# Patient Record
Sex: Male | Born: 1980 | Race: Black or African American | Hispanic: No | Marital: Single | Smoking: Current every day smoker
Health system: Southern US, Community
[De-identification: ages and names within clinical notes are randomized; demographics above are authoritative.]

## PROBLEM LIST (undated history)

## (undated) HISTORY — PX: KNEE SURGERY: SHX244

---

## 2005-11-23 ENCOUNTER — Emergency Department: Payer: Self-pay | Admitting: Unknown Physician Specialty

## 2006-06-12 ENCOUNTER — Emergency Department: Payer: Self-pay | Admitting: Emergency Medicine

## 2008-05-07 ENCOUNTER — Emergency Department: Payer: Self-pay | Admitting: Emergency Medicine

## 2014-09-19 ENCOUNTER — Emergency Department: Payer: No Typology Code available for payment source

## 2014-09-19 ENCOUNTER — Encounter: Payer: Self-pay | Admitting: Emergency Medicine

## 2014-09-19 ENCOUNTER — Emergency Department
Admission: EM | Admit: 2014-09-19 | Discharge: 2014-09-19 | Disposition: A | Discharge status: Home / Self Care | Payer: No Typology Code available for payment source | Attending: Internal Medicine | Admitting: Internal Medicine

## 2014-09-19 DIAGNOSIS — Y92410 Unspecified street and highway as the place of occurrence of the external cause: Secondary | ICD-10-CM | POA: Diagnosis not present

## 2014-09-19 DIAGNOSIS — S060X9A Concussion with loss of consciousness of unspecified duration, initial encounter: Secondary | ICD-10-CM

## 2014-09-19 DIAGNOSIS — S39012A Strain of muscle, fascia and tendon of lower back, initial encounter: Secondary | ICD-10-CM | POA: Diagnosis not present

## 2014-09-19 DIAGNOSIS — S0081XA Abrasion of other part of head, initial encounter: Secondary | ICD-10-CM | POA: Diagnosis not present

## 2014-09-19 DIAGNOSIS — S40211A Abrasion of right shoulder, initial encounter: Secondary | ICD-10-CM | POA: Diagnosis not present

## 2014-09-19 DIAGNOSIS — Y9389 Activity, other specified: Secondary | ICD-10-CM | POA: Diagnosis not present

## 2014-09-19 DIAGNOSIS — S40812A Abrasion of left upper arm, initial encounter: Secondary | ICD-10-CM | POA: Diagnosis not present

## 2014-09-19 DIAGNOSIS — S161XXA Strain of muscle, fascia and tendon at neck level, initial encounter: Secondary | ICD-10-CM | POA: Diagnosis not present

## 2014-09-19 DIAGNOSIS — Y998 Other external cause status: Secondary | ICD-10-CM | POA: Diagnosis not present

## 2014-09-19 DIAGNOSIS — S0990XA Unspecified injury of head, initial encounter: Secondary | ICD-10-CM | POA: Diagnosis present

## 2014-09-19 DIAGNOSIS — Z720 Tobacco use: Secondary | ICD-10-CM | POA: Diagnosis not present

## 2014-09-19 DIAGNOSIS — R52 Pain, unspecified: Secondary | ICD-10-CM

## 2014-09-19 DIAGNOSIS — S060X1A Concussion with loss of consciousness of 30 minutes or less, initial encounter: Secondary | ICD-10-CM

## 2014-09-19 MED ORDER — HYDROCODONE-ACETAMINOPHEN 5-325 MG PO TABS: 1.0000 | ORAL_TABLET | Freq: Four times a day (QID) | ORAL | Status: DC | PRN

## 2014-09-19 MED ORDER — CYCLOBENZAPRINE HCL 10 MG PO TABS: 10.0000 mg | ORAL_TABLET | Freq: Three times a day (TID) | ORAL | Status: AC | PRN

## 2014-09-19 MED ORDER — NAPROXEN 500 MG PO TABS: 500.0000 mg | ORAL_TABLET | Freq: Two times a day (BID) | ORAL | Status: AC

## 2014-09-19 MED ORDER — BACITRACIN 500 UNIT/GM EX OINT
1.0000 "application " | TOPICAL_OINTMENT | Freq: Two times a day (BID) | CUTANEOUS | Status: DC
  Filled 2014-09-19 (×2): qty 0.9

## 2014-09-19 MED ORDER — BACITRACIN ZINC 500 UNIT/GM EX OINT
TOPICAL_OINTMENT | Freq: Two times a day (BID) | CUTANEOUS | Status: DC
  Administered 2014-09-19: 1 via TOPICAL

## 2014-09-19 MED ORDER — BACITRACIN ZINC 500 UNIT/GM EX OINT
TOPICAL_OINTMENT | CUTANEOUS | Status: AC
Start: 2014-09-19 — End: 2014-09-19
  Administered 2014-09-19: 1 via TOPICAL
  Filled 2014-09-19: qty 0.9
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
## 2014-09-19 NOTE — ED Notes (Signed)
Sore back and L arm when he awoke this am

## 2014-09-19 NOTE — Discharge Instructions (Signed)
Abrasions An abrasion is a cut or scrape of the skin. Abrasions do not go through all layers of the skin. HOME CARE  If a bandage (dressing) was put on your wound, change it as told by your doctor. If the bandage sticks, soak it off with warm.  Wash the area with water and soap 2 times a day. Rinse off the soap. Pat the area dry with a clean towel.  Put on medicated cream (ointment) as told by your doctor.  Change your bandage right away if it gets wet or dirty.  Only take medicine as told by your doctor.  See your doctor within 24-48 hours to get your wound checked.  Check your wound for redness, puffiness (swelling), or yellowish-white fluid (pus). GET HELP RIGHT AWAY IF:   You have more pain in the wound.  You have redness, swelling, or tenderness around the wound.  You have pus coming from the wound.  You have a fever or lasting symptoms for more than 2-3 days.  You have a fever and your symptoms suddenly get worse.  You have a bad smell coming from the wound or bandage. MAKE SURE YOU:   Understand these instructions.  Will watch your condition.  Will get help right away if you are not doing well or get worse. Document Released: 10/18/2007 Document Revised: 01/24/2012 Document Reviewed: 04/04/2011 Exit[employer] Patient Information 2015 [employer]. This information is not intended to replace advice given to you by your health care provider. Make sure you discuss any questions you have with your health care provider.  

## 2014-09-19 NOTE — ED Notes (Signed)
NAD noted at time of D/C. Pt denies questions or concerns. Pt ambulatory to the lobby at this time.  

## 2014-10-13 ENCOUNTER — Encounter: Payer: Self-pay | Admitting: Emergency Medicine

## 2014-10-13 ENCOUNTER — Emergency Department
Admission: EM | Admit: 2014-10-13 | Discharge: 2014-10-13 | Disposition: A | Discharge status: Home / Self Care | Payer: BLUE CROSS/BLUE SHIELD | Attending: Emergency Medicine | Admitting: Emergency Medicine

## 2014-10-13 ENCOUNTER — Emergency Department: Payer: BLUE CROSS/BLUE SHIELD

## 2014-10-13 DIAGNOSIS — L989 Disorder of the skin and subcutaneous tissue, unspecified: Secondary | ICD-10-CM

## 2014-10-13 DIAGNOSIS — Z791 Long term (current) use of non-steroidal anti-inflammatories (NSAID): Secondary | ICD-10-CM | POA: Insufficient documentation

## 2014-10-13 DIAGNOSIS — Z79899 Other long term (current) drug therapy: Secondary | ICD-10-CM | POA: Insufficient documentation

## 2014-10-13 DIAGNOSIS — Z720 Tobacco use: Secondary | ICD-10-CM | POA: Insufficient documentation

## 2014-10-13 MED ORDER — CLINDAMYCIN HCL 150 MG PO CAPS
150.0000 mg | ORAL_CAPSULE | Freq: Once | ORAL | Status: AC
  Administered 2014-10-13: 150 mg via ORAL

## 2014-10-13 MED ORDER — CLINDAMYCIN HCL 150 MG PO CAPS
ORAL_CAPSULE | ORAL | Status: AC
  Filled 2014-10-13: qty 1

## 2014-10-13 MED ORDER — CLINDAMYCIN HCL 150 MG PO CAPS: 150.0000 mg | ORAL_CAPSULE | Freq: Four times a day (QID) | ORAL | Status: DC

## 2014-10-13 MED ORDER — OXYCODONE-ACETAMINOPHEN 5-325 MG PO TABS
1.0000 | ORAL_TABLET | Freq: Once | ORAL | Status: AC
  Administered 2014-10-13: 1 via ORAL

## 2014-10-13 MED ORDER — OXYCODONE-ACETAMINOPHEN 7.5-325 MG PO TABS: 1.0000 | ORAL_TABLET | Freq: Four times a day (QID) | ORAL | Status: DC | PRN

## 2014-10-13 MED ORDER — OXYCODONE-ACETAMINOPHEN 5-325 MG PO TABS
ORAL_TABLET | ORAL | Status: DC
Start: 2014-10-13 — End: 2014-10-13
  Filled 2014-10-13: qty 1
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
## 2014-10-13 NOTE — ED Notes (Signed)
Patient ambulatory to triage with steady gait, without difficulty or distress noted; pt reports abscess to left axillae x week; denies hx of same

## 2014-10-13 NOTE — ED Notes (Signed)
Pt informed to return if any life threatening symptoms occur.  

## 2014-10-13 NOTE — Discharge Instructions (Signed)
Take medications as directed and follow up with Surgical clinic if no improvement.

## 2015-07-15 IMAGING — US US MISC SOFT TISSUE
1 series · 14 of 16 positions shown · non-contrast
Comparison: None.

CLINICAL DATA: Skin lesion of chest wall x1 week. Patient
ambulatory to triage with steady gait, without difficulty or
distress noted; pt reports abscess to left axillae x week; denies hx
of same. Initial encounter.

EXAM:
ULTRASOUND OF CHEST SOFT TISSUES
TECHNIQUE: Ultrasound examination of the chest wall soft tissues was performed
in the area of clinical concern.

[Series 1: us misc soft tissue · 0.05mm/px · 14 of 38 slices shown]
[im 1/38]
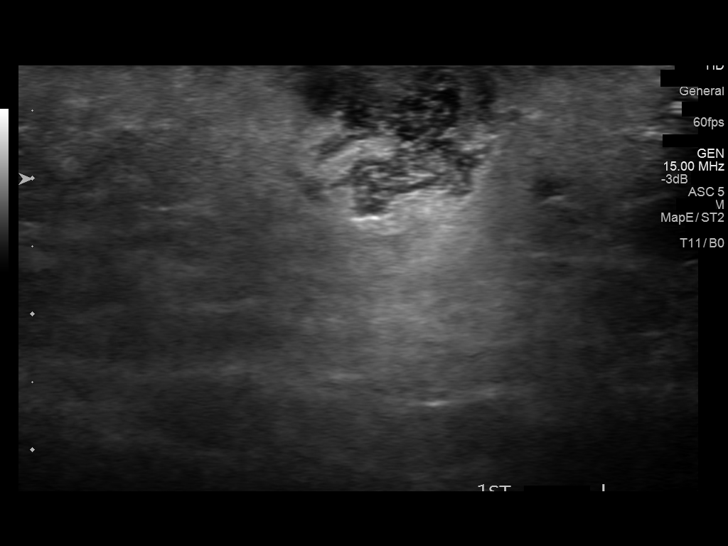
[im 3/38]
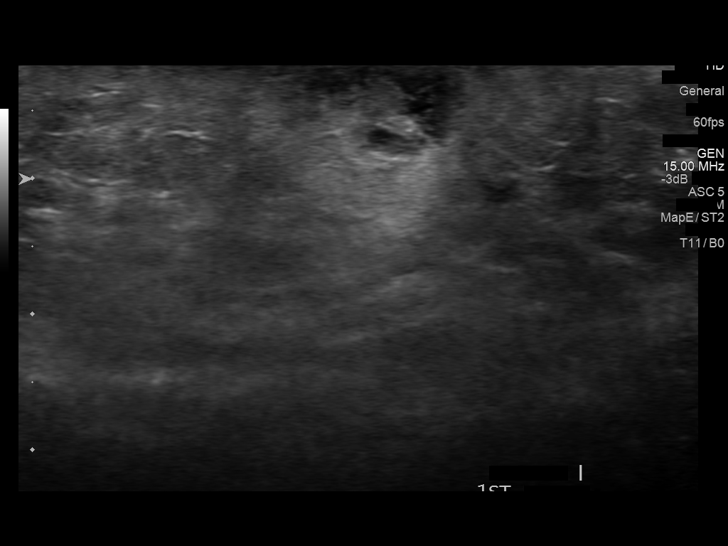
[im 5/38]
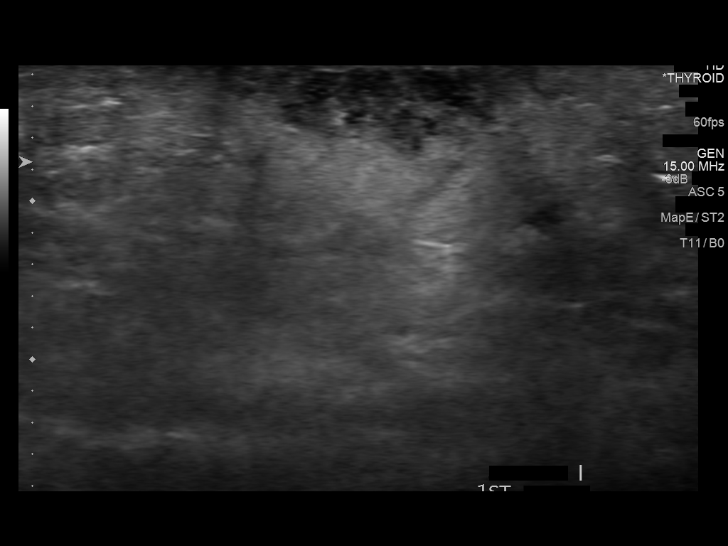
[im 10/38]
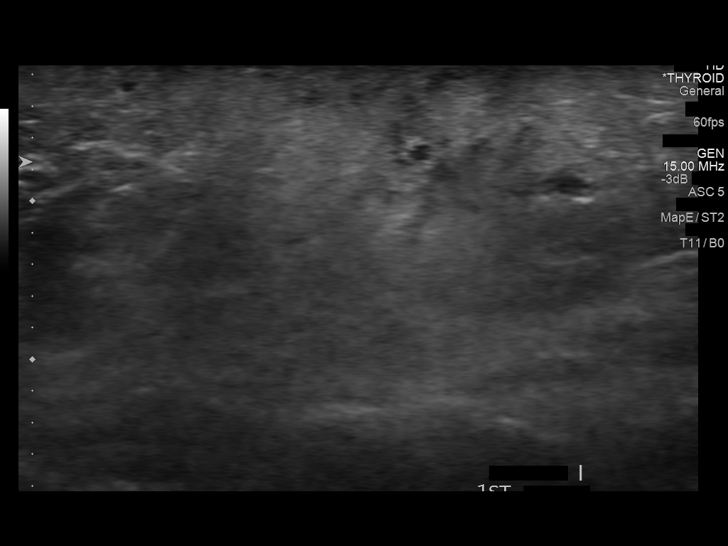
[im 13/38]
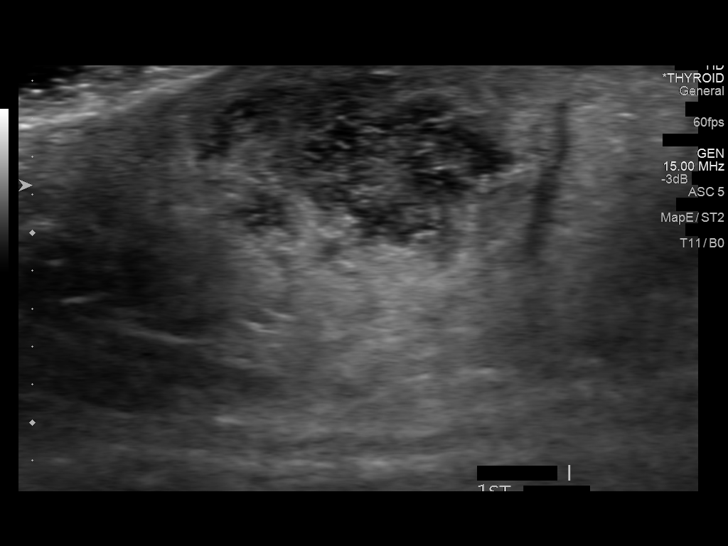
[im 15/38]
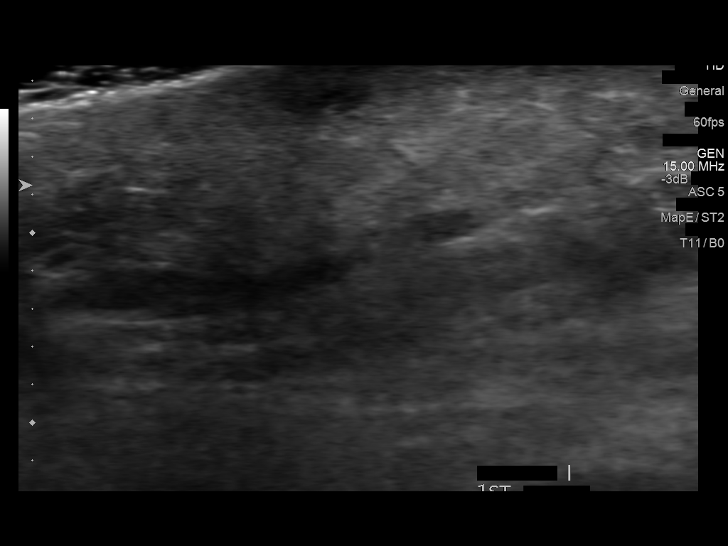
[im 18/38]
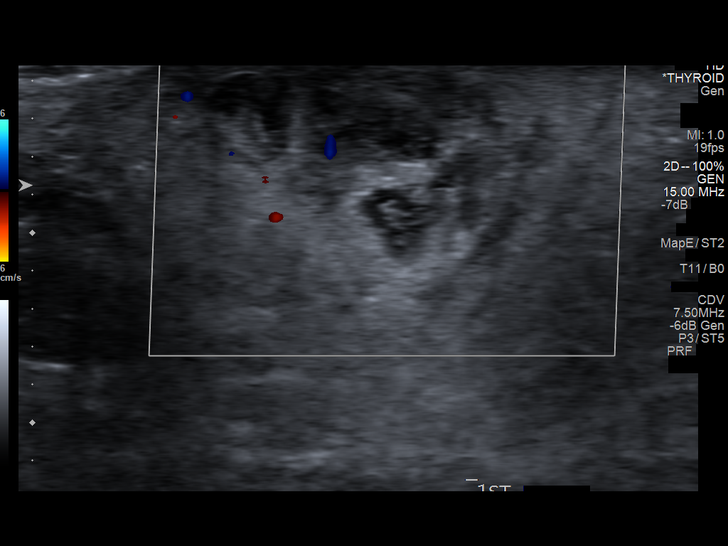
[im 20/38]
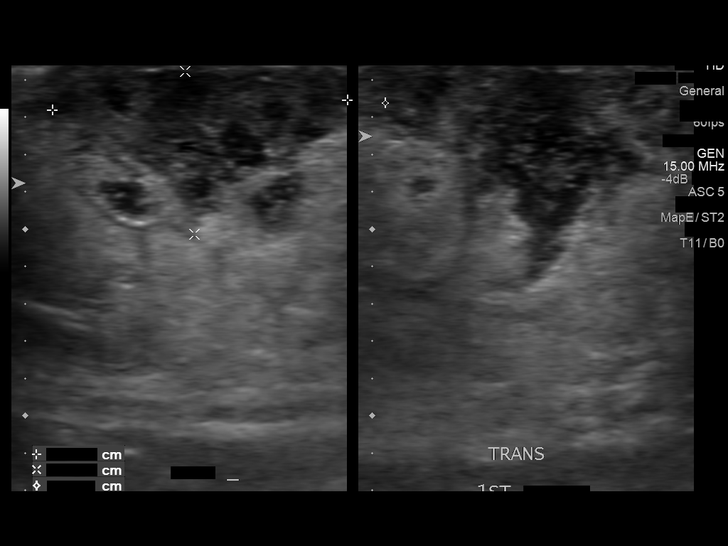
[im 23/38]
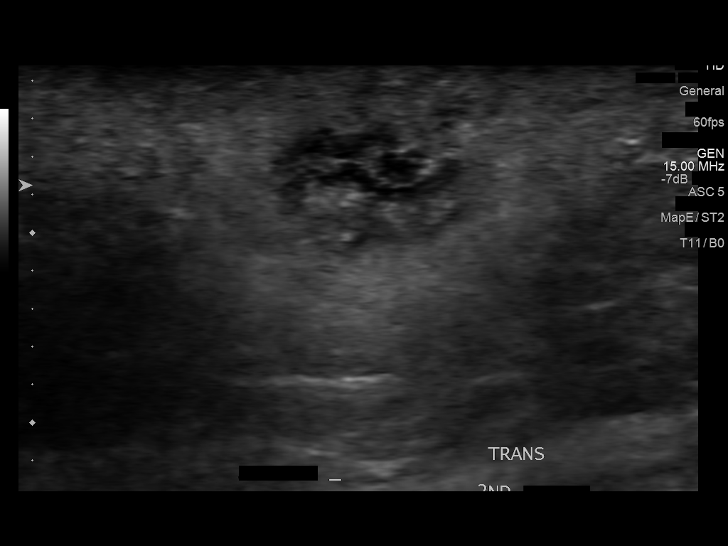
[im 25/38]
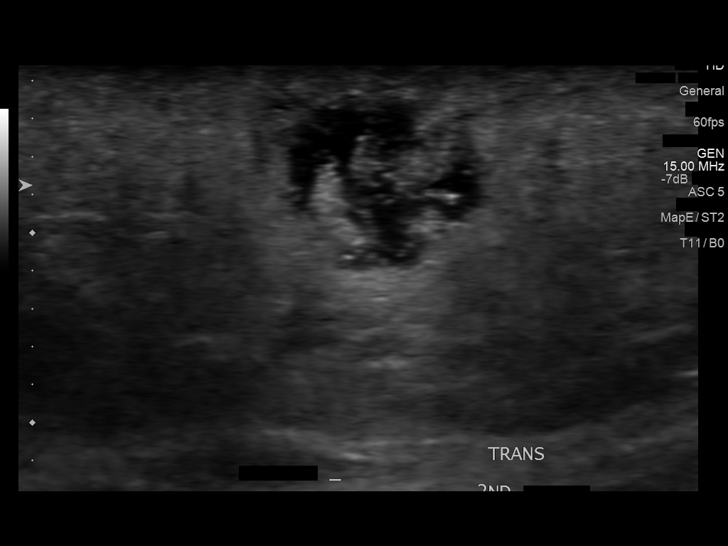
[im 30/38]
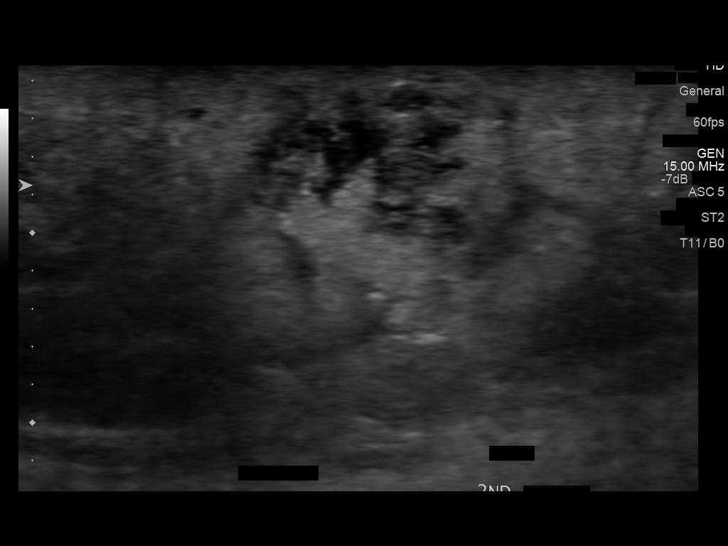
[im 33/38]
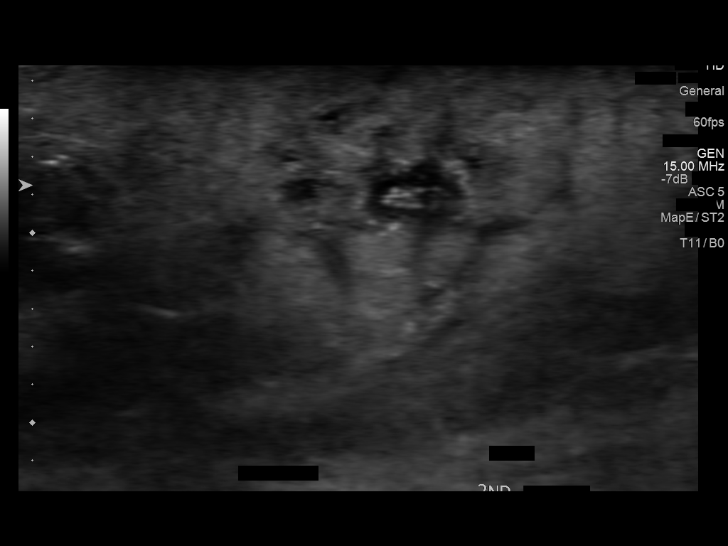
[im 35/38]
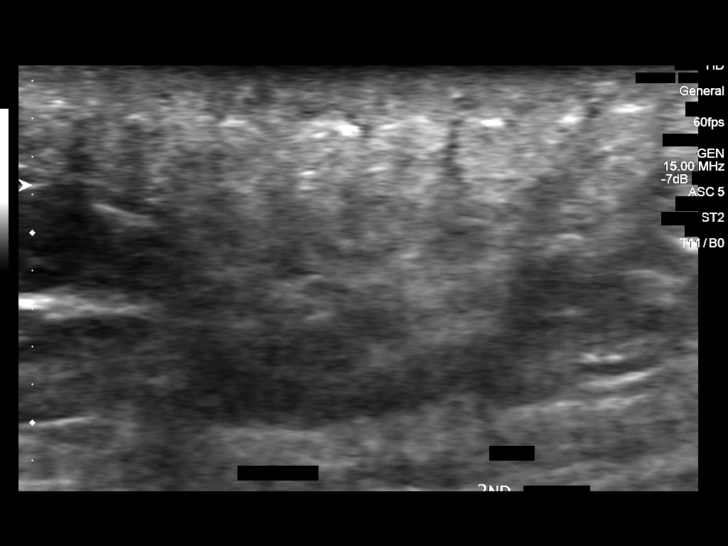
[im 38/38]
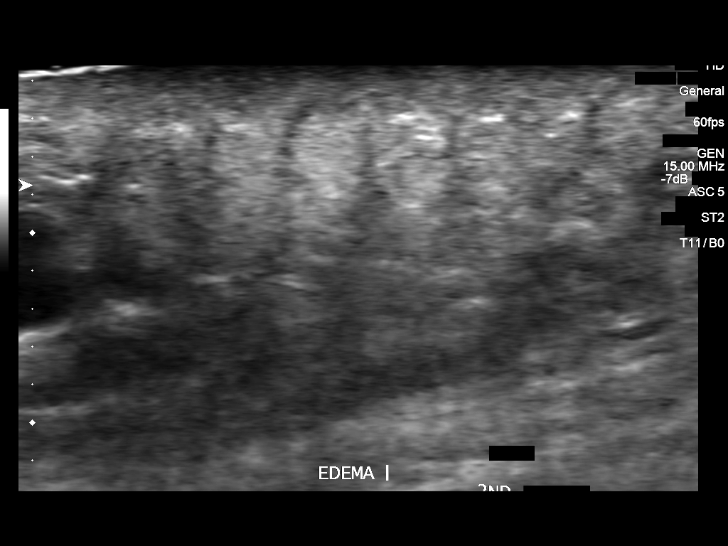

[14 of 16 positions shown; findings below may reference images not displayed]

FINDINGS: Examination is targeted to left lateral chest wall inferior to the
axilla. In the area of concern, there are 2 superficial complex
lesions with solid and cystic components. The more superior lesion
measures 1.6 x 0.9 x 1.6 cm. The more inferior lesion measures 1.7 x
1.5 x 1.4 cm. There is mild associated blood flow within these
lesions.
IMPRESSION: Highly nonspecific small complex lesions within the left lateral
chest wall at area of patient's concern may represent early
abscesses or necrotic masses. No easily drainable lesions identified
at this time. Close clinical follow up recommended.

## 2015-09-15 ENCOUNTER — Emergency Department
Admission: EM | Admit: 2015-09-15 | Discharge: 2015-09-15 | Disposition: A | Discharge status: Home / Self Care | Payer: BLUE CROSS/BLUE SHIELD | Attending: Emergency Medicine | Admitting: Emergency Medicine

## 2015-09-15 ENCOUNTER — Encounter: Payer: Self-pay | Admitting: Emergency Medicine

## 2015-09-15 DIAGNOSIS — J0110 Acute frontal sinusitis, unspecified: Secondary | ICD-10-CM | POA: Diagnosis not present

## 2015-09-15 DIAGNOSIS — Z79899 Other long term (current) drug therapy: Secondary | ICD-10-CM | POA: Insufficient documentation

## 2015-09-15 DIAGNOSIS — F17200 Nicotine dependence, unspecified, uncomplicated: Secondary | ICD-10-CM | POA: Insufficient documentation

## 2015-09-15 DIAGNOSIS — J40 Bronchitis, not specified as acute or chronic: Secondary | ICD-10-CM

## 2015-09-15 DIAGNOSIS — Z792 Long term (current) use of antibiotics: Secondary | ICD-10-CM | POA: Diagnosis not present

## 2015-09-15 DIAGNOSIS — J209 Acute bronchitis, unspecified: Secondary | ICD-10-CM | POA: Insufficient documentation

## 2015-09-15 DIAGNOSIS — R0981 Nasal congestion: Secondary | ICD-10-CM | POA: Diagnosis present

## 2015-09-15 MED ORDER — PSEUDOEPH-BROMPHEN-DM 30-2-10 MG/5ML PO SYRP: 10.0000 mL | ORAL_SOLUTION | Freq: Four times a day (QID) | ORAL | Status: DC | PRN

## 2015-09-15 MED ORDER — AMOXICILLIN-POT CLAVULANATE 875-125 MG PO TABS: 1.0000 | ORAL_TABLET | Freq: Two times a day (BID) | ORAL | Status: DC

## 2015-09-15 NOTE — ED Notes (Signed)
States he developed some body aches,fever and cough since this past weekend.  Afebrile at present .Marland Kitchen.occasional prod cough

## 2015-09-15 NOTE — ED Provider Notes (Signed)
[facility] Emergency Department Provider Note  ____________________________________________  Time seen: Approximately 6:54 PM  I have reviewed the triage vital signs and the nursing notes.   HISTORY  Chief Complaint Cough and Fever    HPI Leonard Duran is a 35 y.o. male , NAD, presents to emergency department with five-day history of cough, chest congestion, nasal drainage, nasal congestion, fever, body aches and fatigue. Was seen at the [facility] 2 months ago and was told he was given 2 much amoxicillin so he's been taking those tablets over the last couple of days along with utilizing albuterol inhaler and Flonase. States symptoms are not alleviating. Was exposed to a child with strep in the last few days. Denies any sore throat at this time. Has not had any difficulty breathing or wheezing. No abdominal pain, nausea, vomiting, diarrhea.   History reviewed. No pertinent past medical history.  There are no active problems to display for this patient.   Past Surgical History  Procedure Laterality Date  . Knee surgery Right     Current Outpatient Rx  Name  Route  Sig  Dispense  Refill  . amoxicillin-clavulanate (AUGMENTIN) 875-125 MG tablet   Oral   Take 1 tablet by mouth 2 (two) times daily.   20 tablet   0   . brompheniramine-pseudoephedrine-DM 30-2-10 MG/5ML syrup   Oral   Take 10 mLs by mouth 4 (four) times daily as needed.   200 mL   0   . clindamycin (CLEOCIN) 150 MG capsule   Oral   Take 1 capsule (150 mg total) by mouth 4 (four) times daily.   40 capsule   0   . cyclobenzaprine (FLEXERIL) 10 MG tablet   Oral   Take 1 tablet (10 mg total) by mouth every 8 (eight) hours as needed for muscle spasms.   30 tablet   0   . HYDROcodone-acetaminophen (NORCO) 5-325 MG per tablet   Oral   Take 1-2 tablets by mouth every 6 (six) hours as needed for moderate pain.   18 tablet   0   . naproxen (NAPROSYN) 500 MG tablet   Oral   Take 1  tablet (500 mg total) by mouth 2 (two) times daily with a meal.   60 tablet   2   . oxyCODONE-acetaminophen (PERCOCET) 7.5-325 MG per tablet   Oral   Take 1 tablet by mouth every 6 (six) hours as needed for severe pain.   12 tablet   0     Allergies Review of patient's allergies indicates no known allergies.  No family history on file.  Social History Social History  Substance Use Topics  . Smoking status: Current Every Day Smoker -- 0.50 packs/day  . Smokeless tobacco: None  . Alcohol Use: 4.2 oz/week    7 Cans of beer per week     Review of Systems  Constitutional: Positive fever, chills, aches Eyes: No visual changes. No discharge, redness, swelling ENT: Eyes of nasal congestion, runny nose, sinus pressure. No sore throat or ear pain, sneezing. Cardiovascular: No chest pain. Respiratory: Positive chest congestion, cough. No shortness of breath. No wheezing.  Gastrointestinal: No abdominal pain.  No nausea, vomiting.  No diarrhea.  Musculoskeletal: Positive for general myalgias.  Skin: Negative for rash. Neurological: Positive for headaches, but no focal weakness or numbness. 10-point ROS otherwise negative.  ____________________________________________   PHYSICAL EXAM:  VITAL SIGNS: ED Triage Vitals  Enc Vitals Group     BP 09/15/15  1818 129/89 mmHg     Pulse Rate 09/15/15 1818 80     Resp 09/15/15 1818 18     Temp 09/15/15 1818 98.1 F (36.7 C)     Temp Source 09/15/15 1818 Oral     SpO2 09/15/15 1818 96 %     Weight 09/15/15 1818 242 lb (109.77 kg)     Height 09/15/15 1818  (1.88 m)     Head Cir --      Peak Flow --      Pain Score 09/15/15 1815 6     Pain Loc --      Pain Edu? --      Excl. in GC? --      Constitutional: Alert and oriented. Ill appearing but in no acute distress. Eyes: Conjunctivae are normal. PERRL. EOMI without pain.  Head: Atraumatic. ENT:      Ears: TMs visualized bilaterally without erythema, effusion, bulging,  perforation.      Nose: Moderate congestion with moderate clear rhinnorhea.      Mouth/Throat: Mucous membranes are moist. Pharynx without erythema, swelling, exudates. Clear postnasal drip noted. Uvula is midline. Neck: No stridor. Supple with full range of motion. No meningismus. Hematological/Lymphatic/Immunilogical: No cervical lymphadenopathy. Cardiovascular: Normal rate, regular rhythm. Normal S1 and S2.   Respiratory: Normal respiratory effort without tachypnea or retractions. Lungs CTAB with breath sounds noted in all lung fields. Neurologic:  Normal speech and language. No gross focal neurologic deficits are appreciated.  Skin:  Skin is warm, dry and intact. No rash noted. Psychiatric: Mood and affect are normal. Speech and behavior are normal. Patient exhibits appropriate insight and judgement.   ____________________________________________   LABS  None ____________________________________________  EKG  None ____________________________________________  RADIOLOGY  None ____________________________________________    PROCEDURES  Procedure(s) performed: None    Medications - No data to display   ____________________________________________   INITIAL IMPRESSION / ASSESSMENT AND PLAN / ED COURSE  Patient's diagnosis is consistent with acute frontal sinusitis with bronchitis. Patient will be discharged home with prescriptions for Augmentin and Bromfed-DM syrup to take as directed. Patient may continue to utilize albuterol inhaler and Flonase as needed. Advised patient to alternate Tylenol and ibuprofen as needed for pain or aches. Patient is to follow up with [facility] if symptoms persist past this treatment course. Patient is given ED precautions to return to the ED for any worsening or new symptoms.    ____________________________________________  FINAL CLINICAL IMPRESSION(S) / ED DIAGNOSES  Final diagnoses:  Acute frontal sinusitis, recurrence  not specified  Bronchitis      NEW MEDICATIONS STARTED DURING THIS VISIT:  Discharge Medication List as of 09/15/2015  7:00 PM    START taking these medications   Details  amoxicillin-clavulanate (AUGMENTIN) 875-125 MG tablet Take 1 tablet by mouth 2 (two) times daily., Starting 09/15/2015, Until Discontinued, Print    brompheniramine-pseudoephedrine-DM 30-2-10 MG/5ML syrup Take 10 mLs by mouth 4 (four) times daily as needed., Starting 09/15/2015, Until Discontinued, Print             Hope Pigeon, PA-C 09/15/15 1913  Phineas Semen, MD 09/15/15 2010

## 2015-09-15 NOTE — Discharge Instructions (Signed)
Sinus Rinse °WHAT IS A SINUS RINSE? °A sinus rinse is a home treatment. It rinses your sinuses with a mixture of salt and water (saline solution). Sinuses are air-filled spaces in your skull behind the bones of your face and forehead. They open into your nasal cavity. °To do a sinus rinse, you will need: °· Saline solution. °· Neti pot or spray bottle. This releases the saline solution into your nose and through your sinuses. You can buy neti pots and spray bottles at: °¨ Your local pharmacy. °¨ A health food store. °¨ Online. °WHEN WOULD I DO A SINUS RINSE?  °A sinus rinse can help to clear your nasal cavity. It can clear:  °· Mucus. °· Dirt. °· Dust. °· Pollen. °You may do a sinus rinse when you have: °· A cold. °· A virus. °· Allergies. °· A sinus infection. °· A stuffy nose. °If you are considering a sinus rinse: °· Ask your child's doctor before doing a sinus rinse on your child. °· Do not do a sinus rinse if you have had: °· Ear or nasal surgery. °· An ear infection. °· Blocked ears. °HOW DO I DO A SINUS RINSE?  °· Wash your hands. °· Disinfect your device using the directions that came with the device. °· Dry your device. °· Use the solution that comes with your device or one that is sold separately in stores. Follow the mixing directions on the package. °· Fill your device with the amount of saline solution as stated in the device instructions. °· Stand over a sink and tilt your head sideways over the sink. °· Place the spout of the device in your upper nostril (the one closer to the ceiling). °· Gently pour or squeeze the saline solution into the nasal cavity. The liquid should drain to the lower nostril if you are not too congested. °· Gently blow your nose. Blowing too hard may cause ear pain. °· Repeat in the other nostril. °· Clean and rinse your device with clean water. °· Air-dry your device. °ARE THERE RISKS OF A SINUS RINSE?  °Sinus rinse is normally very safe and helpful. However, there are a few  risks, which include:  °· A burning feeling in the sinuses. This may happen if you do not make the saline solution as instructed. Make sure to follow all directions when making the saline solution. °· Infection from unclean water. This is rare, but possible. °· Nasal irritation. °  °This information is not intended to replace advice given to you by your health care provider. Make sure you discuss any questions you have with your health care provider. °  °Document Released: 11/26/2013 Document Reviewed: 11/26/2013 °Elsevier Interactive Patient Education ©2016 Elsevier Inc. ° °Sinusitis, Adult °Sinusitis is redness, soreness, and puffiness (inflammation) of the air pockets in the bones of your face (sinuses). The redness, soreness, and puffiness can cause air and mucus to get trapped in your sinuses. This can allow germs to grow and cause an infection.  °HOME CARE  °· Drink enough fluids to keep your pee (urine) clear or pale yellow. °· Use a humidifier in your home. °· Run a hot shower to create steam in the bathroom. Sit in the bathroom with the door closed. Breathe in the steam 3-4 times a day. °· Put a warm, moist washcloth on your face 3-4 times a day, or as told by your doctor. °· Use salt water sprays (saline sprays) to wet the thick fluid in your nose. This can   help the sinuses drain.  Only take medicine as told by your doctor. GET HELP RIGHT AWAY IF:   Your pain gets worse.  You have very bad headaches.  You are sick to your stomach (nauseous).  You throw up (vomit).  You are very sleepy (drowsy) all the time.  Your face is puffy (swollen).  Your vision changes.  You have a stiff neck.  You have trouble breathing. MAKE SURE YOU:   Understand these instructions.  Will watch your condition.  Will get help right away if you are not doing well or get worse.   This information is not intended to replace advice given to you by your health care provider. Make sure you discuss any  questions you have with your health care provider.   Document Released: 10/18/2007 Document Revised: 05/22/2014 Document Reviewed: 12/05/2011 Elsevier Interactive Patient Education 2016 Elsevier Inc.  Upper Respiratory Infection, Adult Most upper respiratory infections (URIs) are caused by a virus. A URI affects the nose, throat, and upper air passages. The most common type of URI is often called "the common cold." HOME CARE   Take medicines only as told by your doctor.  Gargle warm saltwater or take cough drops to comfort your throat as told by your doctor.  Use a warm mist humidifier or inhale steam from a shower to increase air moisture. This may make it easier to breathe.  Drink enough fluid to keep your pee (urine) clear or pale yellow.  Eat soups and other clear broths.  Have a healthy diet.  Rest as needed.  Go back to work when your fever is gone or your doctor says it is okay.  You may need to stay home longer to avoid giving your URI to others.  You can also wear a face mask and wash your hands often to prevent spread of the virus.  Use your inhaler more if you have asthma.  Do not use any tobacco products, including cigarettes, chewing tobacco, or electronic cigarettes. If you need help quitting, ask your doctor. GET HELP IF:  You are getting worse, not better.  Your symptoms are not helped by medicine.  You have chills.  You are getting more short of breath.  You have brown or red mucus.  You have yellow or brown discharge from your nose.  You have pain in your face, especially when you bend forward.  You have a fever.  You have puffy (swollen) neck glands.  You have pain while swallowing.  You have white areas in the back of your throat. GET HELP RIGHT AWAY IF:   You have very bad or constant:  Headache.  Ear pain.  Pain in your forehead, behind your eyes, and over your cheekbones (sinus pain).  Chest pain.  You have long-lasting  (chronic) lung disease and any of the following:  Wheezing.  Long-lasting cough.  Coughing up blood.  A change in your usual mucus.  You have a stiff neck.  You have changes in your:  Vision.  Hearing.  Thinking.  Mood. MAKE SURE YOU:   Understand these instructions.  Will watch your condition.  Will get help right away if you are not doing well or get worse.   This information is not intended to replace advice given to you by your health care provider. Make sure you discuss any questions you have with your health care provider.   Document Released: 10/18/2007 Document Revised: 09/15/2014 Document Reviewed: 08/06/2013 Elsevier Interactive Patient Education [employer] Elsevier Inc.

## 2015-09-15 NOTE — ED Notes (Signed)
C/O cough and flu symptoms.  Onset of symptoms Saturday.  Seen at [facility] PTA.  Also had similar symptoms 2 months ago and treated with amoxicillin, albuterol inhaler and flonase.  Naprosyn taken about 30 min PTA
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
## 2017-01-25 ENCOUNTER — Encounter: Payer: Self-pay | Admitting: Emergency Medicine

## 2017-01-25 ENCOUNTER — Emergency Department: Payer: Self-pay

## 2017-01-25 ENCOUNTER — Emergency Department
Admission: EM | Admit: 2017-01-25 | Discharge: 2017-01-25 | Disposition: A | Discharge status: Home / Self Care | Payer: Self-pay | Attending: Student in an Organized Health Care Education/Training Program | Admitting: Student in an Organized Health Care Education/Training Program

## 2017-01-25 DIAGNOSIS — R1084 Generalized abdominal pain: Secondary | ICD-10-CM | POA: Insufficient documentation

## 2017-01-25 DIAGNOSIS — F17210 Nicotine dependence, cigarettes, uncomplicated: Secondary | ICD-10-CM | POA: Insufficient documentation

## 2017-01-25 DIAGNOSIS — K529 Noninfective gastroenteritis and colitis, unspecified: Secondary | ICD-10-CM | POA: Insufficient documentation

## 2017-01-25 LAB — COMPREHENSIVE METABOLIC PANEL
ALBUMIN: 3.9 g/dL (ref 3.5–5.0)
ALT: 19 U/L (ref 17–63)
ANION GAP: 7 (ref 5–15)
AST: 19 U/L (ref 15–41)
Alkaline Phosphatase: 47 U/L (ref 38–126)
BUN: 10 mg/dL (ref 6–20)
CO2: 26 mmol/L (ref 22–32)
Calcium: 9.2 mg/dL (ref 8.9–10.3)
Chloride: 105 mmol/L (ref 101–111)
Creatinine, Ser: 1.51 mg/dL — ABNORMAL HIGH (ref 0.61–1.24)
GFR calc non Af Amer: 58 mL/min — ABNORMAL LOW (ref >60)
Glucose, Bld: 100 mg/dL — ABNORMAL HIGH (ref 65–99)
Potassium: 4 mmol/L (ref 3.5–5.1)
SODIUM: 138 mmol/L (ref 135–145)
Total Bilirubin: 0.7 mg/dL (ref 0.3–1.2)
Total Protein: 7.2 g/dL (ref 6.5–8.1)

## 2017-01-25 LAB — URINALYSIS, COMPLETE (UACMP) WITH MICROSCOPIC
BACTERIA UA: NONE SEEN
Bilirubin Urine: NEGATIVE
Glucose, UA: NEGATIVE mg/dL
HGB URINE DIPSTICK: NEGATIVE
KETONES UR: NEGATIVE mg/dL
Leukocytes, UA: NEGATIVE
Nitrite: NEGATIVE
Protein, ur: NEGATIVE mg/dL
RBC / HPF: NONE SEEN RBC/hpf (ref 0–5)
SPECIFIC GRAVITY, URINE: 1.02 (ref 1.005–1.030)
WBC UA: NONE SEEN WBC/hpf (ref 0–5)
pH: 7 (ref 5.0–8.0)

## 2017-01-25 LAB — CBC
HEMATOCRIT: 42.4 % (ref 40.0–52.0)
HEMOGLOBIN: 14.3 g/dL (ref 13.0–18.0)
MCH: 29.1 pg (ref 26.0–34.0)
MCHC: 33.8 g/dL (ref 32.0–36.0)
MCV: 86.1 fL (ref 80.0–100.0)
Platelets: 235 10*3/uL (ref 150–440)
RBC: 4.93 MIL/uL (ref 4.40–5.90)
RDW: 14.7 % — ABNORMAL HIGH (ref 11.5–14.5)
WBC: 8.9 10*3/uL (ref 3.8–10.6)

## 2017-01-25 LAB — LIPASE, BLOOD: Lipase: 21 U/L (ref 11–51)

## 2017-01-25 MED ORDER — SODIUM CHLORIDE 0.9 % IV BOLUS (SEPSIS)
1000.0000 mL | Freq: Once | INTRAVENOUS | Status: AC
  Administered 2017-01-25: 1000 mL via INTRAVENOUS

## 2017-01-25 MED ORDER — HYDROCODONE-ACETAMINOPHEN 5-325 MG PO TABS: 1.0000 | ORAL_TABLET | ORAL | 0 refills | Status: DC | PRN

## 2017-01-25 MED ORDER — DICYCLOMINE HCL 10 MG PO CAPS
10.0000 mg | ORAL_CAPSULE | Freq: Once | ORAL | Status: AC
  Administered 2017-01-25: 10 mg via ORAL
  Filled 2017-01-25: qty 1

## 2017-01-25 MED ORDER — PROMETHAZINE HCL 12.5 MG PO TABS: 12.5000 mg | ORAL_TABLET | Freq: Four times a day (QID) | ORAL | 0 refills | Status: DC | PRN

## 2017-01-25 MED ORDER — METRONIDAZOLE 500 MG PO TABS
250.0000 mg | ORAL_TABLET | Freq: Once | ORAL | Status: AC
  Administered 2017-01-25: 250 mg via ORAL
  Filled 2017-01-25: qty 1

## 2017-01-25 MED ORDER — HYDROCODONE-ACETAMINOPHEN 5-325 MG PO TABS
1.0000 | ORAL_TABLET | Freq: Once | ORAL | Status: AC
  Administered 2017-01-25: 1 via ORAL
  Filled 2017-01-25: qty 1

## 2017-01-25 MED ORDER — CIPROFLOXACIN HCL 500 MG PO TABS
250.0000 mg | ORAL_TABLET | Freq: Once | ORAL | Status: AC
  Administered 2017-01-25: 250 mg via ORAL
  Filled 2017-01-25: qty 1

## 2017-01-25 MED ORDER — MORPHINE SULFATE (PF) 4 MG/ML IV SOLN
4.0000 mg | INTRAVENOUS | Status: DC | PRN
  Administered 2017-01-25: 4 mg via INTRAVENOUS
  Filled 2017-01-25: qty 1

## 2017-01-25 MED ORDER — PROMETHAZINE HCL 25 MG/ML IJ SOLN: 12.5000 mg | Freq: Four times a day (QID) | INTRAMUSCULAR | Status: DC | PRN

## 2017-01-25 MED ORDER — IOPAMIDOL (ISOVUE-300) INJECTION 61%
100.0000 mL | Freq: Once | INTRAVENOUS | Status: AC | PRN
  Administered 2017-01-25: 100 mL via INTRAVENOUS

## 2017-01-25 MED ORDER — CIPROFLOXACIN HCL 250 MG PO TABS: 250.0000 mg | ORAL_TABLET | Freq: Two times a day (BID) | ORAL | 0 refills | Status: AC

## 2017-01-25 MED ORDER — METRONIDAZOLE 250 MG PO TABS: 250.0000 mg | ORAL_TABLET | Freq: Three times a day (TID) | ORAL | 0 refills | Status: AC

## 2017-01-25 NOTE — ED Notes (Signed)
Blood draw left ac.  Unable to get urine right now, so I gave him cup and wipes.

## 2017-01-25 NOTE — ED Notes (Signed)
Pt aware of need for urine sample, unable to go currently

## 2017-01-25 NOTE — Discharge Instructions (Signed)

## 2017-01-25 NOTE — ED Triage Notes (Signed)
Says pain low abd that comes and goes since Sunday.  Sharp pain.

## 2017-01-25 NOTE — ED Notes (Addendum)
Pt sitting on bed, NAD

## 2017-01-25 NOTE — ED Provider Notes (Signed)
[facility] Emergency Department Provider Note    First MD Initiated Contact with Patient 01/25/17 1258     (approximate)  I have reviewed the triage vital signs and the nursing notes.   HISTORY  Chief Complaint Abdominal Pain    HPI Leonard Duran is a 36 y.o. male presents with several days of intermittent mid epigastric and right lower quadrant abdominal pain associated one episode of vomiting. Is having normal stools. No fevers. States that when the pain occurs it is severe and 10 out of 10 in severity. Doesn't drink alcohol was never had this type of pain before. Denies any chest pain or shortness of breath. No forceful retching or vomiting.   History reviewed. No pertinent past medical history. No family history on file. Past Surgical History:  Procedure Laterality Date  . KNEE SURGERY Right    There are no active problems to display for this patient.     Prior to Admission medications   Medication Sig Start Date End Date Taking? Authorizing Provider  brompheniramine-pseudoephedrine-DM 30-2-10 MG/5ML syrup Take 10 mLs by mouth 4 (four) times daily as needed. Patient not taking: Reported on 01/25/2017 09/15/15   Hagler, Jami L, PA-C  HYDROcodone-acetaminophen (NORCO) 5-325 MG per tablet Take 1-2 tablets by mouth every 6 (six) hours as needed for moderate pain. Patient not taking: Reported on 01/25/2017 09/19/14   Kem Boroughs B, FNP  oxyCODONE-acetaminophen (PERCOCET) 7.5-325 MG per tablet Take 1 tablet by mouth every 6 (six) hours as needed for severe pain. Patient not taking: Reported on 01/25/2017 10/13/14   Joni Reining, PA-C    Allergies Patient has no known allergies.    Social History Social History  Substance Use Topics  . Smoking status: Current Every Day Smoker    Packs/day: 0.50  . Smokeless tobacco: Never Used  . Alcohol use 4.2 oz/week    7 Cans of beer per week    Review of Systems Patient denies headaches, rhinorrhea,  blurry vision, numbness, shortness of breath, chest pain, edema, cough, abdominal pain, nausea, vomiting, diarrhea, dysuria, fevers, rashes or hallucinations unless otherwise stated above in HPI. ____________________________________________   PHYSICAL EXAM:  VITAL SIGNS: Vitals:   01/25/17 1330 01/25/17 1447  BP: 130/87 (!) 135/101  Pulse: (!) 53 (!) 57  Resp: 16 17  Temp:    SpO2: 100% 99%    Constitutional: Alert and oriented. Well appearing and in no acute distress. Eyes: Conjunctivae are normal.  Head: Atraumatic. Nose: No congestion/rhinnorhea. Mouth/Throat: Mucous membranes are moist.   Neck: No stridor. Painless ROM.  Cardiovascular: Normal rate, regular rhythm. Grossly normal heart sounds.  Good peripheral circulation. Respiratory: Normal respiratory effort.  No retractions. Lungs CTAB. Gastrointestinal: Soft and with mild epigastric ttp. No distention. No abdominal bruits. No CVA tenderness. Genitourinary:  Musculoskeletal: No lower extremity tenderness nor edema.  No joint effusions. Neurologic:  Normal speech and language. No gross focal neurologic deficits are appreciated. No facial droop Skin:  Skin is warm, dry and intact. No rash noted. Psychiatric: Mood and affect are normal. Speech and behavior are normal.  ____________________________________________   LABS (all labs ordered are listed, but only abnormal results are displayed)  Results for orders placed or performed during the hospital encounter of 01/25/17 (from the past 24 hour(s))  Lipase, blood     Status: None   Collection Time: 01/25/17 12:00 PM  Result Value Ref Range   Lipase 21 11 - 51 U/L  Comprehensive metabolic panel  Status: Abnormal   Collection Time: 01/25/17 12:00 PM  Result Value Ref Range   Sodium 138 135 - 145 mmol/L   Potassium 4.0 3.5 - 5.1 mmol/L   Chloride 105 101 - 111 mmol/L   CO2 26 22 - 32 mmol/L   Glucose, Bld 100 (H) 65 - 99 mg/dL   BUN 10 6 - 20 mg/dL   Creatinine,  Ser 7.82 (H) 0.61 - 1.24 mg/dL   Calcium 9.2 8.9 - 95.6 mg/dL   Total Protein 7.2 6.5 - 8.1 g/dL   Albumin 3.9 3.5 - 5.0 g/dL   AST 19 15 - 41 U/L   ALT 19 17 - 63 U/L   Alkaline Phosphatase 47 38 - 126 U/L   Total Bilirubin 0.7 0.3 - 1.2 mg/dL   GFR calc non Af Amer 58 (L) >60 mL/min   GFR calc Af Amer >60 >60 mL/min   Anion gap 7 5 - 15  CBC     Status: Abnormal   Collection Time: 01/25/17 12:00 PM  Result Value Ref Range   WBC 8.9 3.8 - 10.6 K/uL   RBC 4.93 4.40 - 5.90 MIL/uL   Hemoglobin 14.3 13.0 - 18.0 g/dL   HCT 21.3 08.6 - 57.8 %   MCV 86.1 80.0 - 100.0 fL   MCH 29.1 26.0 - 34.0 pg   MCHC 33.8 32.0 - 36.0 g/dL   RDW 46.9 (H) 62.9 - 52.8 %   Platelets 235 150 - 440 K/uL  Urinalysis, Complete w Microscopic     Status: Abnormal   Collection Time: 01/25/17  2:07 PM  Result Value Ref Range   Color, Urine YELLOW YELLOW   APPearance CLEAR CLEAR   Specific Gravity, Urine 1.020 1.005 - 1.030   pH 7.0 5.0 - 8.0   Glucose, UA NEGATIVE NEGATIVE mg/dL   Hgb urine dipstick NEGATIVE NEGATIVE   Bilirubin Urine NEGATIVE NEGATIVE   Ketones, ur NEGATIVE NEGATIVE mg/dL   Protein, ur NEGATIVE NEGATIVE mg/dL   Nitrite NEGATIVE NEGATIVE   Leukocytes, UA NEGATIVE NEGATIVE   Squamous Epithelial / LPF 0-5 (A) NONE SEEN   WBC, UA NONE SEEN 0 - 5 WBC/hpf   RBC / HPF NONE SEEN 0 - 5 RBC/hpf   Bacteria, UA NONE SEEN NONE SEEN   ____________________________________________  ____________________________________________  RADIOLOGY  I personally reviewed all radiographic images ordered to evaluate for the above acute complaints and reviewed radiology reports and findings.  These findings were personally discussed with the patient.  Please see medical record for radiology report.  ____________________________________________   PROCEDURES  Procedure(s) performed:  Procedures    Critical Care performed: no ____________________________________________   INITIAL IMPRESSION /  ASSESSMENT AND PLAN / ED COURSE  Pertinent labs & imaging results that were available during my care of the patient were reviewed by me and considered in my medical decision making (see chart for details).  DDX: perforation, sbo, appendicitis, colitis, ibd, stone, uti  BRAYDIN ALOI is a 36 y.o. who presents to the ED with Diffuse intermittent abdominal pain as described above. Blood work is reassuring the patient's afebrile however based on his pain CT imaging of the abdomen and pelvis was ordered. It does show evidence of non-complicated colitis. I don't see any skin lesions to suggest Crohn's. As he is otherwise tolerating oral medications and his pain is controlled at this point I will start the patient drove outpatient antibiotics with referral to GI and PCP.  Have discussed with the patient and available family  all diagnostics and treatments performed thus far and all questions were answered to the best of my ability. The patient demonstrates understanding and agreement with plan.       ____________________________________________   FINAL CLINICAL IMPRESSION(S) / ED DIAGNOSES  Final diagnoses:  Generalized abdominal pain  Colitis      NEW MEDICATIONS STARTED DURING THIS VISIT:  New Prescriptions   No medications on file     Note:  This document was prepared using Dragon voice recognition software and may include unintentional dictation errors.    Willy Eddyobinson, Ermine Stebbins, MD 01/25/17 [phone]

## 2017-01-25 NOTE — ED Notes (Signed)
Patient transported to CT 

## 2017-01-25 NOTE — ED Notes (Signed)
This nurse and Dorene GrebeNatalie RN performed hourly rounding on patient. Offered pain prn pain meds, pt stated he was not in pain and did not need anything at this time.

## 2019-05-18 IMAGING — CT CT ABD-PELV W/ CM
2 of 4 series · 16 of 46 positions shown, 18 images · IV contrast (APPLIED)
Comparison: None.

CLINICAL DATA: Mid abdominal pain for 4 days. One episode of
vomiting.

EXAM:
CT ABDOMEN AND PELVIS WITH CONTRAST
TECHNIQUE: Multidetector CT imaging of the abdomen and pelvis was performed
using the standard protocol following bolus administration of
intravenous contrast.
CONTRAST:  100mL TR8NQF-855 IOPAMIDOL (TR8NQF-855) INJECTION 61%

[Series 2: routine abd/pel with · axial · 0.77mm/px · z∈[-943,-498]mm · 13 of 99 slices shown, 15 images]
[im 5/99  soft-tissue]
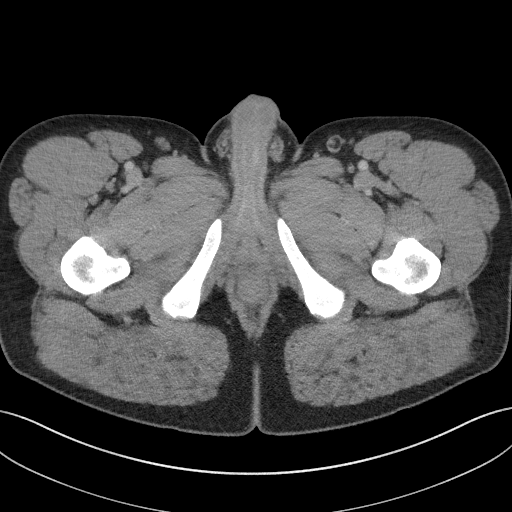
[im 5/99  bone]
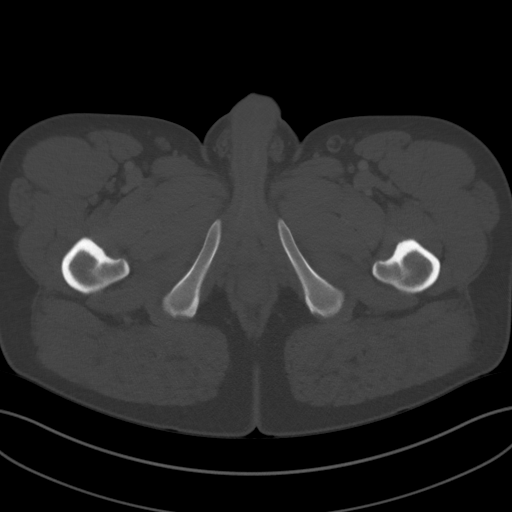
[im 13/99  soft-tissue]
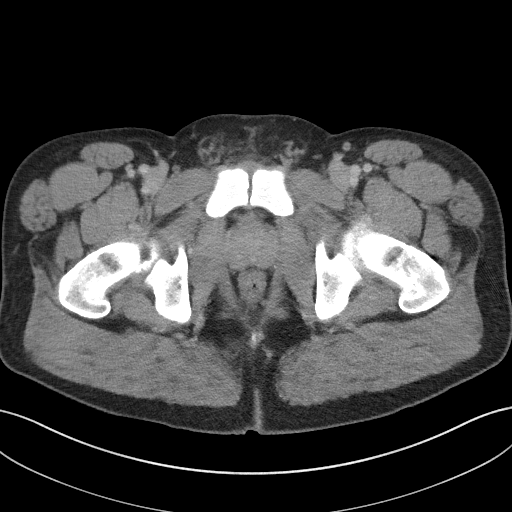
[im 21/99  soft-tissue]
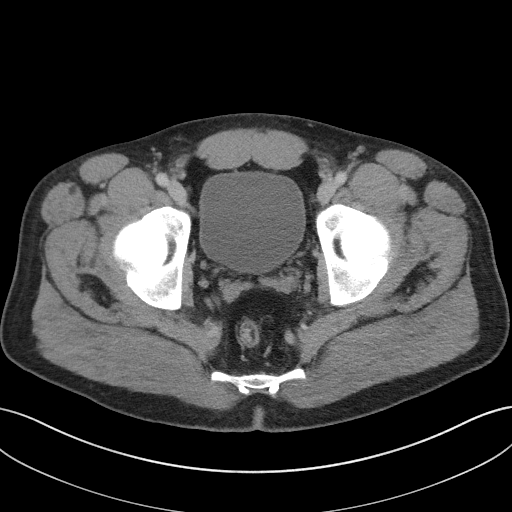
[im 29/99  soft-tissue]
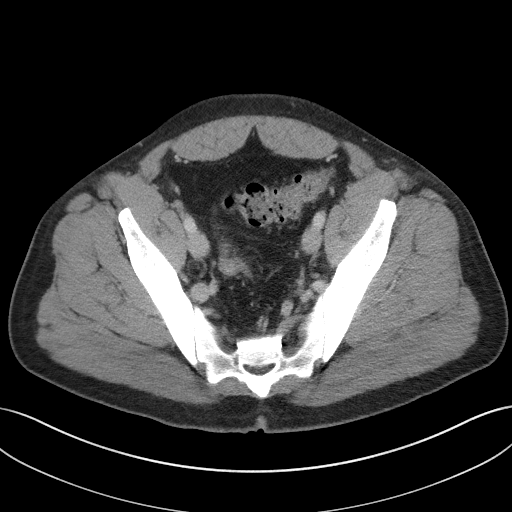
[im 33/99  soft-tissue]
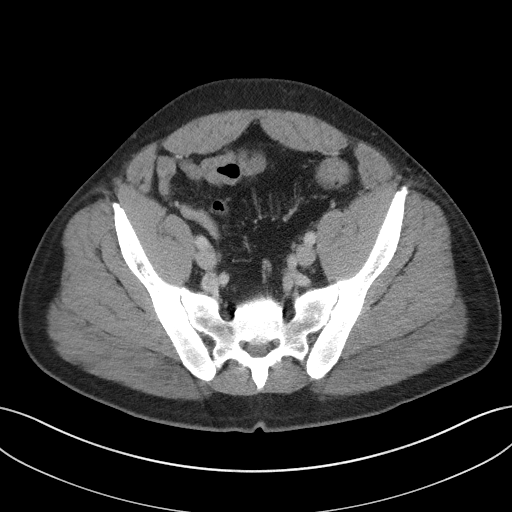
[im 41/99  soft-tissue]
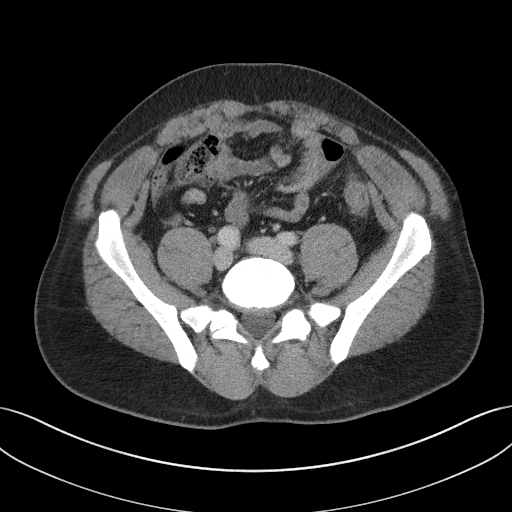
[im 50/99  soft-tissue]
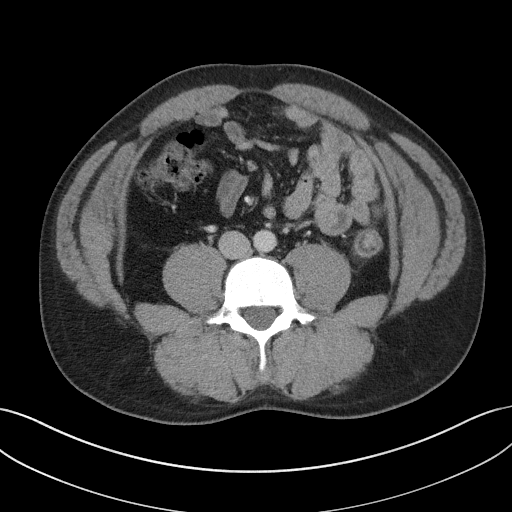
[im 58/99  soft-tissue]
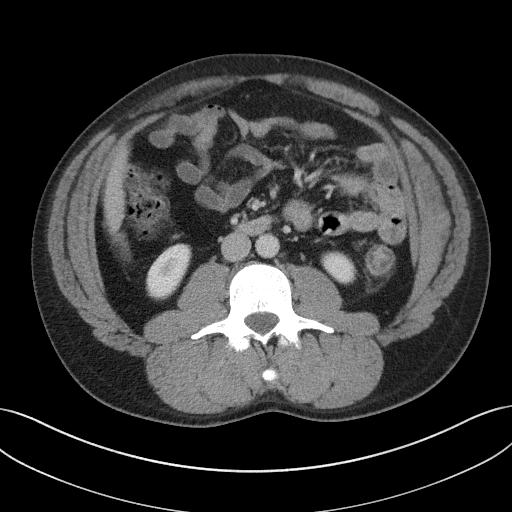
[im 66/99  soft-tissue]
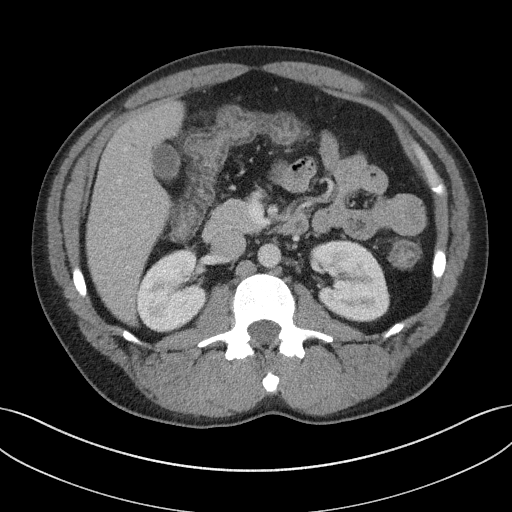
[im 66/99  bone]
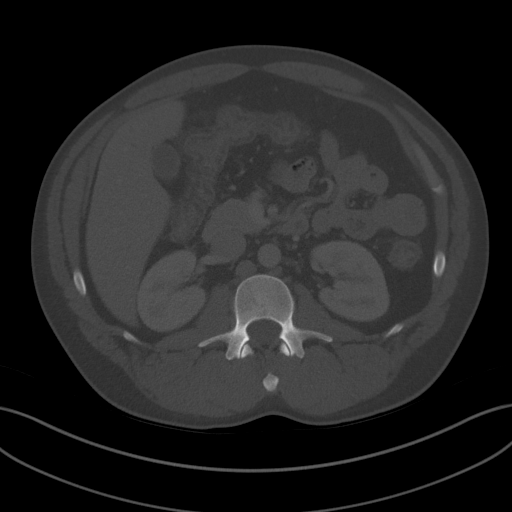
[im 70/99  soft-tissue]
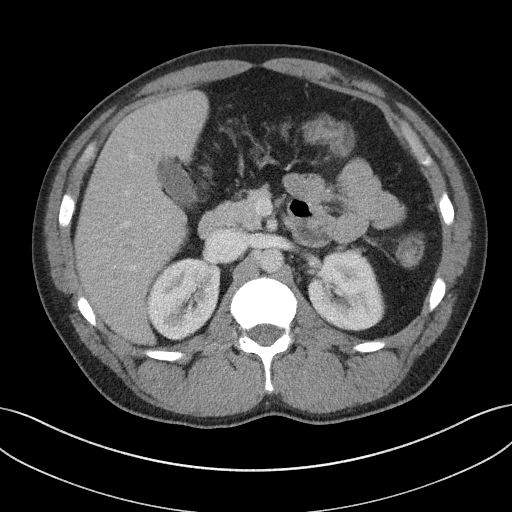
[im 78/99  soft-tissue]
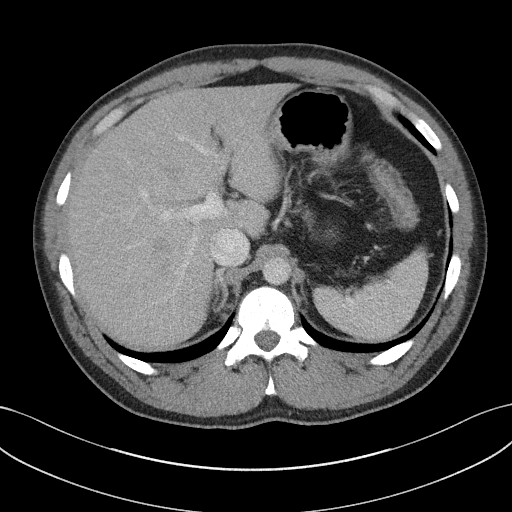
[im 86/99  soft-tissue]
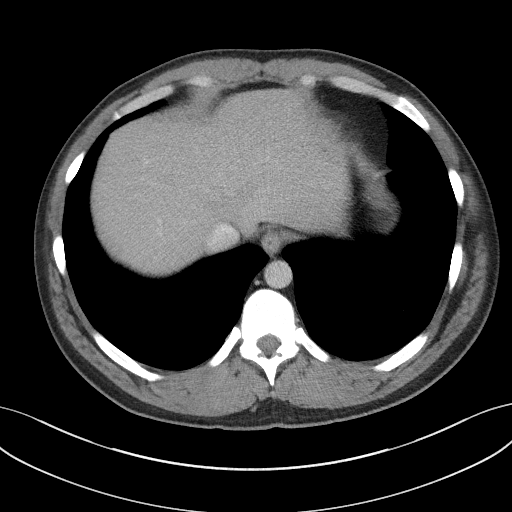
[im 94/99  soft-tissue]
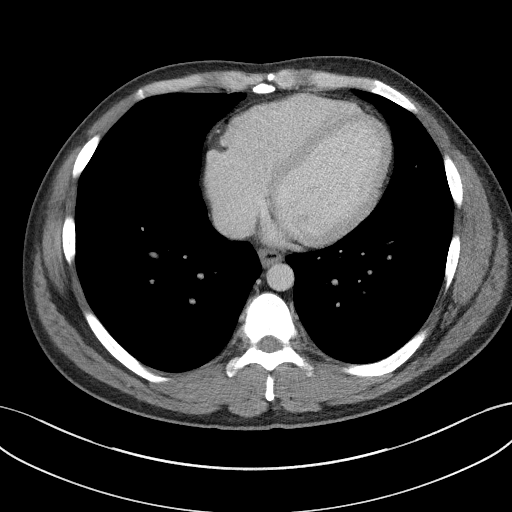

[Series 5: coronal st · coronal · 0.68mm/px · 3 of 101 slices shown]
[im 34/101  soft-tissue]
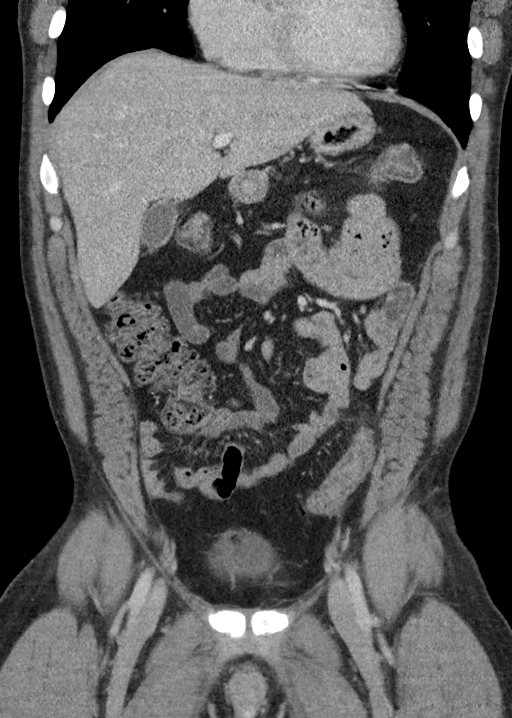
[im 45/101  soft-tissue]
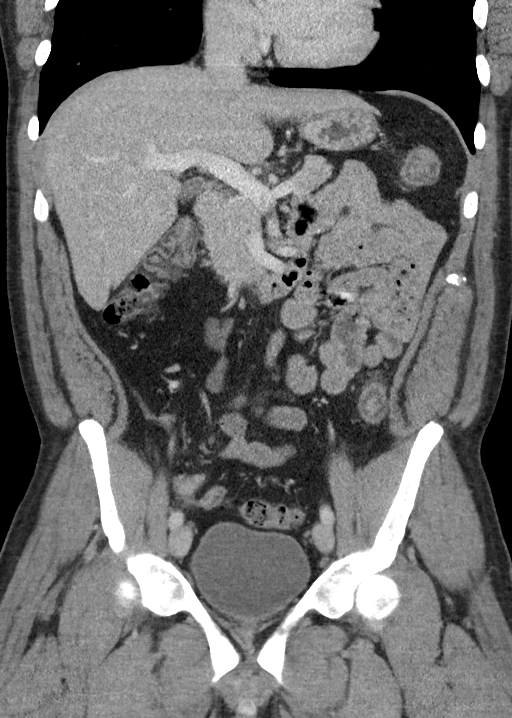
[im 56/101  soft-tissue]
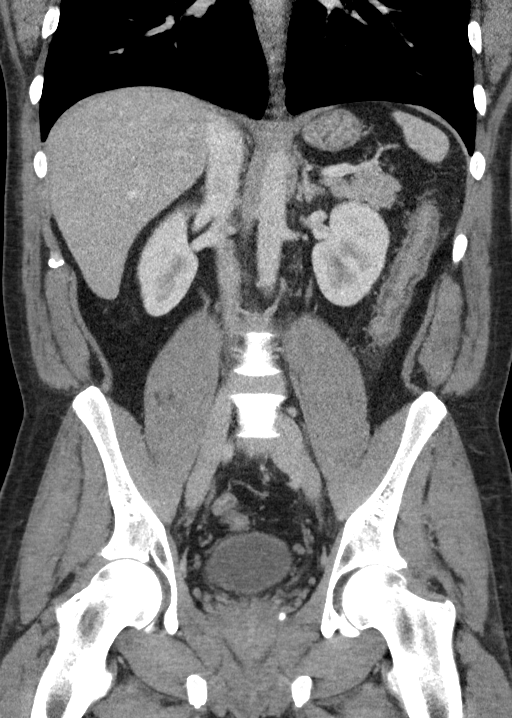

[16 of 46 positions shown; findings below may reference images not displayed]

FINDINGS: Lower chest:  No contributory findings.

Hepatobiliary: No focal liver abnormality.No evidence of biliary
obstruction or stone.

Pancreas: Unremarkable.

Spleen: Unremarkable.

Adrenals/Urinary Tract: Negative adrenals. No hydronephrosis or
stone. Small area of mild cortical scarring versus developmental
cleft the left lower pole. Unremarkable bladder.

Stomach/Bowel: Mild fat stranding primarily around the mid and
distal colon, best seen at the descending segment. No definite wall
thickening or submucosal edema, although prominent generalized
submucosal fat could obscure these changes. No appendicitis. No
terminal ileitis.

Vascular/Lymphatic: No acute vascular abnormality. No mass or
adenopathy.

Reproductive:No pathologic findings.

Other: No ascites or pneumoperitoneum.

Musculoskeletal: No acute abnormalities.
IMPRESSION: Diffuse pericolonic fat stranding consistent with nonspecific
colitis.

## 2023-11-08 ENCOUNTER — Emergency Department
Admission: EM | Admit: 2023-11-08 | Discharge: 2023-11-08 | Disposition: A | Discharge status: Home / Self Care | Attending: Emergency Medicine | Admitting: Emergency Medicine

## 2023-11-08 ENCOUNTER — Other Ambulatory Visit: Payer: Self-pay

## 2023-11-08 ENCOUNTER — Encounter: Payer: Self-pay | Admitting: Emergency Medicine

## 2023-11-08 DIAGNOSIS — J020 Streptococcal pharyngitis: Secondary | ICD-10-CM | POA: Insufficient documentation

## 2023-11-08 DIAGNOSIS — R509 Fever, unspecified: Secondary | ICD-10-CM | POA: Diagnosis present

## 2023-11-08 LAB — RESP PANEL BY RT-PCR (RSV, FLU A&B, COVID)  RVPGX2
Influenza A by PCR: NEGATIVE
Influenza B by PCR: NEGATIVE
Resp Syncytial Virus by PCR: NEGATIVE
SARS Coronavirus 2 by RT PCR: NEGATIVE

## 2023-11-08 LAB — MONONUCLEOSIS SCREEN: Mono Screen: NEGATIVE

## 2023-11-08 LAB — GROUP A STREP BY PCR: Group A Strep by PCR: DETECTED — AB

## 2023-11-08 MED ORDER — AMOXICILLIN-POT CLAVULANATE 875-125 MG PO TABS
1.0000 | ORAL_TABLET | Freq: Two times a day (BID) | ORAL | 0 refills | Status: AC
Start: 2023-11-08 — End: 2023-11-18

## 2023-11-08 NOTE — ED Provider Notes (Signed)
 [facility] Provider Note    Event Date/Time   First MD Initiated Contact with Patient 11/08/23 1242     (approximate)   History   Sore Throat   HPI  Leonard Duran is a 43 y.o. male presenting with sore throat, fever, and odynophagia x 2 weeks.  Denies congestion, cough, muscle aches, chest pain, abdominal pain, rash, vomiting.  He is able to lie down in a supine position and breathe and swallow well.  Denies sick contacts but has been around kids recently.  No allergies.  Patient has a history of GERD.     Physical Exam   Triage Vital Signs: ED Triage Vitals  Encounter Vitals Group     BP 11/08/23 1231 (!) 129/91     Girls Systolic BP Percentile --      Girls Diastolic BP Percentile --      Boys Systolic BP Percentile --      Boys Diastolic BP Percentile --      Pulse Rate 11/08/23 1231 80     Resp 11/08/23 1231 17     Temp 11/08/23 1231 99.3 F (37.4 C)     Temp Source 11/08/23 1231 Oral     SpO2 11/08/23 1231 99 %     Weight 11/08/23 1230 240 lb 1.3 oz (108.9 kg)     Height 11/08/23 1230 6' 1 (1.854 m)     Head Circumference --      Peak Flow --      Pain Score 11/08/23 1230 6     Pain Loc --      Pain Education --      Exclude from Growth Chart --     Most recent vital signs: Vitals:   11/08/23 1231  BP: (!) 129/91  Pulse: 80  Resp: 17  Temp: 99.3 F (37.4 C)  SpO2: 99%     General: Well-appearing, in no acute distress. Appears stated age. Head: Normocephalic, atraumatic. Eyes: PERRLA. EOMs intact. No scleral icterus or conjunctival injection. Ears/Nose/Throat: TMs intact b/l. Nares patent, no nasal discharge. Oropharynx moist, bilateral erythema, but no fluctuance or exudate. Dentition intact.  No muffled voice.  Able to lie supine with adequate breathing. Neck: Supple, no lymphadenopathy, no nuchal rigidity. CV: Regular rate, 80 bpm. Peripheral pulses 2+ and symmetric. No edema. Respiratory: Breath sounds clear b/l. No  wheezes, rales, or rhonchi. No respiratory distress. Normal respiratory effort. GI: Soft, non-distended. Skin:Warm, dry, intact. No rashes, lesions, or ecchymosis. No cyanosis or pallor. Neurological: A&Ox4 to person, place, time, and situation.  Psychiatric: Mood and affect appropriate. Thought processes coherent.  No trismus.  ED Results / Procedures / Treatments   Labs (all labs ordered are listed, but only abnormal results are displayed) Labs Reviewed  GROUP A STREP BY PCR - Abnormal; Notable for the following components:      Result Value   Group A Strep by PCR DETECTED (*)    All other components within normal limits  RESP PANEL BY RT-PCR (RSV, FLU A&B, COVID)  [id number]  MONONUCLEOSIS SCREEN     EKG     RADIOLOGY    PROCEDURES:  Critical Care performed: No  Procedures   MEDICATIONS ORDERED IN ED: Medications - No data to display   IMPRESSION / MDM / ASSESSMENT AND PLAN / ED COURSE  I reviewed the triage vital signs and the nursing notes.  Differential diagnosis includes, but is not limited to, strep pharyngitis, viral pharyngitis, viral URI, peritonsillar or retropharyngeal abscess, rheumatic fever  Patient's presentation is most consistent with acute complicated illness / injury requiring diagnostic workup.  Patient is a 43 year old male who presented today with sore throat, fever, and odynophagia x 2 weeks.  Strep PCR, monoscreen, and respiratory panel ordered.  Strep PCR is positive.  Patient is without any trismus, can easily lie down and is able to breathe in this position.  No change in voice or drooling present on exam, no trismus.  No peritonsillar abscess seen on exam.  As he has had symptoms for 2 weeks, I was concerned regarding rheumatic fever.  He does not have any chest pain, rash on his body, migratory joint pain, abnormalities when listening to his heart on exam.  I will prescribe him with 10 days of Augmentin  for his  strep throat.  Work note given.  Patient was given the opportunity to ask questions; all questions were answered. Emergency department return precautions were discussed with the patient.  Patient is in agreement to the treatment plan.  Patient is stable for discharge.    FINAL CLINICAL IMPRESSION(S) / ED DIAGNOSES   Final diagnoses:  Strep pharyngitis     Rx / DC Orders   ED Discharge Orders          Ordered    amoxicillin -clavulanate (AUGMENTIN ) 875-125 MG tablet  2 times daily        11/08/23 1343             Note:  This document was prepared using Dragon voice recognition software and may include unintentional dictation errors.    Sheron Salm, PA-C 11/08/23 1350    Dorothyann Drivers, MD 11/08/23 [phone]

## 2023-11-08 NOTE — Discharge Instructions (Addendum)
 Take antibiotics as prescribed. Ensure adequate hydration and rest. Use salt water gargles and lozenges (ex. Hard candy) as soothing remedies. Alternate ibuprofen and/or Tylenol  for fever and/or pain relief. Throw away toothbrush after 3 days. The patient may return to school once 24 hours with antibiotics.   Return to the emergency department if you experience any difficulty breathing, increased difficulty swallowing especially when lying down, increased drooling, change in voice, or any other new or worsening symptoms.

## 2023-11-08 NOTE — ED Triage Notes (Signed)
 Pt here with a sore throat x1 week. Pt also having pain in the back of her head and neck. Pt states it hurts to swallow. Small white patch on the right side of pt's throat.

## 2023-11-08 NOTE — ED Notes (Signed)
 See triage note  Presents with sore throat which started about 1 week ago Having pain with swallowing  Low grade temp on arrival

## 2024-04-28 ENCOUNTER — Ambulatory Visit
Admission: EM | Admit: 2024-04-28 | Discharge: 2024-04-28 | Disposition: A | Discharge status: Home / Self Care | Attending: Nurse Practitioner | Admitting: Nurse Practitioner

## 2024-04-28 DIAGNOSIS — H6991 Unspecified Eustachian tube disorder, right ear: Secondary | ICD-10-CM | POA: Diagnosis not present

## 2024-04-28 DIAGNOSIS — H9201 Otalgia, right ear: Secondary | ICD-10-CM

## 2024-04-28 DIAGNOSIS — J020 Streptococcal pharyngitis: Secondary | ICD-10-CM

## 2024-04-28 LAB — POCT RAPID STREP A (OFFICE): Rapid Strep A Screen: POSITIVE — AB

## 2024-04-28 LAB — POC COVID19/FLU A&B COMBO
Covid Antigen, POC: NEGATIVE
Influenza A Antigen, POC: NEGATIVE
Influenza B Antigen, POC: NEGATIVE

## 2024-04-28 MED ORDER — AMOXICILLIN 500 MG PO CAPS: 500.0000 mg | ORAL_CAPSULE | Freq: Two times a day (BID) | ORAL | 0 refills | Status: AC

## 2024-04-28 MED ORDER — LIDOCAINE VISCOUS HCL 2 % MT SOLN: OROMUCOSAL | 0 refills | Status: AC

## 2024-04-28 MED ORDER — CETIRIZINE HCL 10 MG PO TABS: 10.0000 mg | ORAL_TABLET | Freq: Every day | ORAL | 0 refills | Status: AC

## 2024-04-28 MED ORDER — FLUTICASONE PROPIONATE 50 MCG/ACT NA SUSP: 2.0000 | Freq: Every day | NASAL | 0 refills | Status: AC

## 2024-04-28 NOTE — ED Provider Notes (Signed)
 [facility]    CSN: [id number] Arrival date & time: 04/28/24  1848      History   Chief Complaint Chief Complaint  Patient presents with   Sore Throat   Otalgia    HPI ERICE AHLES is a 43 y.o. male.   The history is provided by the patient.   Patient presents for complaints of sore throat, right ear pain, cough, nasal congestion, and bodyaches.  Symptoms have been present for the past 2 days.  Patient denies fever, chills, headache, ear drainage, wheezing, difficulty breathing, chest pain, abdominal pain, nausea, vomiting, diarrhea, or rash.  Patient states he has been taking over-the-counter Mucinex and Robitussin for his symptoms with minimal relief.  He denies any obvious close sick contacts.  History reviewed. No pertinent past medical history.  There are no active problems to display for this patient.   Past Surgical History:  Procedure Laterality Date   KNEE SURGERY Right        Home Medications    Prior to Admission medications  Not on File    Family History History reviewed. No pertinent family history.  Social History Social History[1]   Allergies   Patient has no known allergies.   Review of Systems Review of Systems Per HPI  Physical Exam Triage Vital Signs ED Triage Vitals  Encounter Vitals Group     BP 04/28/24 1856 121/81     Girls Systolic BP Percentile --      Girls Diastolic BP Percentile --      Boys Systolic BP Percentile --      Boys Diastolic BP Percentile --      Pulse Rate 04/28/24 1856 70     Resp 04/28/24 1856 16     Temp 04/28/24 1856 98.6 F (37 C)     Temp Source 04/28/24 1856 Oral     SpO2 04/28/24 1856 96 %     Weight --      Height --      Head Circumference --      Peak Flow --      Pain Score 04/28/24 1857 6     Pain Loc --      Pain Education --      Exclude from Growth Chart --    No data found.  Updated Vital Signs BP 121/81 (BP Location: Right Arm)   Pulse 70   Temp 98.6 F (37  C) (Oral)   Resp 16   SpO2 96%   Visual Acuity Right Eye Distance:   Left Eye Distance:   Bilateral Distance:    Right Eye Near:   Left Eye Near:    Bilateral Near:     Physical Exam Vitals and nursing note reviewed.  Constitutional:      General: He is not in acute distress.    Appearance: Normal appearance.  HENT:     Head: Normocephalic.     Right Ear: Ear canal and external ear normal. A middle ear effusion is present.     Left Ear: Tympanic membrane, ear canal and external ear normal.     Nose: Congestion present.     Right Turbinates: Enlarged and swollen.     Left Turbinates: Enlarged and swollen.     Right Sinus: No maxillary sinus tenderness or frontal sinus tenderness.     Left Sinus: No maxillary sinus tenderness or frontal sinus tenderness.     Mouth/Throat:     Lips: Pink.     Mouth:  Mucous membranes are moist.     Pharynx: Uvula midline. Pharyngeal swelling, posterior oropharyngeal erythema and uvula swelling present. No oropharyngeal exudate.     Tonsils: 1+ on the right. 1+ on the left.     Comments: Cobblestoning present to posterior oropharynx  Eyes:     Extraocular Movements: Extraocular movements intact.     Conjunctiva/sclera: Conjunctivae normal.     Pupils: Pupils are equal, round, and reactive to light.  Cardiovascular:     Rate and Rhythm: Normal rate and regular rhythm.     Pulses: Normal pulses.     Heart sounds: Normal heart sounds.  Pulmonary:     Effort: Pulmonary effort is normal. No respiratory distress.     Breath sounds: Normal breath sounds. No stridor. No wheezing, rhonchi or rales.  Abdominal:     General: Bowel sounds are normal.     Palpations: Abdomen is soft.  Musculoskeletal:     Cervical back: Normal range of motion.  Lymphadenopathy:     Cervical: No cervical adenopathy.  Skin:    General: Skin is warm and dry.  Neurological:     General: No focal deficit present.     Mental Status: He is alert and oriented to person,  place, and time.  Psychiatric:        Mood and Affect: Mood normal.        Behavior: Behavior normal.      UC Treatments / Results  Labs (all labs ordered are listed, but only abnormal results are displayed) Labs Reviewed  POCT RAPID STREP A (OFFICE) - Abnormal; Notable for the following components:      Result Value   Rapid Strep A Screen Positive (*)    All other components within normal limits  POC COVID19/FLU A&B COMBO    EKG   Radiology No results found.  Procedures Procedures (including critical care time)  Medications Ordered in UC Medications - No data to display  Initial Impression / Assessment and Plan / UC Course  I have reviewed the triage vital signs and the nursing notes.  Pertinent labs & imaging results that were available during my care of the patient were reviewed by me and considered in my medical decision making (see chart for details).  The COVID/flu test was negative, rapid strep test was positive.  On exam, patient with a right middle ear effusion most likely causing his otalgia.  Treat strep throat with amoxicillin  500 mg and viscous lidocaine  2%.  For the right eustachian tube dysfunction, treat with cetirizine  10 mg and fluticasone  50 mcg nasal spray.  Supportive care recommendations were provided and discussed with the patient to include fluids, rest, over-the-counter analgesics, warm salt water gargles, soft diet, and discarding his toothbrush after 3 days.  Also advised patient to apply warm compresses to the right ear and over-the-counter analgesics.  Discussed indications with the patient regarding follow-up.  Patient was in agreement with this plan of care and verbalizes understanding.  All questions were answered.  Patient stable for discharge.  Work note was provided.  Final Clinical Impressions(s) / UC Diagnoses   Final diagnoses:  None   Discharge Instructions   None    ED Prescriptions   None    PDMP not reviewed this  encounter.    [1]  Social History Tobacco Use   Smoking status: Every Day    Current packs/day: 0.50    Types: Cigarettes   Smokeless tobacco: Never  Substance Use Topics   Alcohol use:  Yes    Alcohol/week: 7.0 standard drinks of alcohol    Types: 7 Cans of beer per week   Drug use: Never     Gilmer Etta PARAS, NP 04/28/24 1926

## 2024-04-28 NOTE — Discharge Instructions (Addendum)
 The rapid strep test was positive.  The COVID/flu test was negative. Take medication as prescribed. Increase fluids and allow for plenty of rest. You may take over-the-counter Tylenol  or ibuprofen  as needed for pain, fever, or general discomfort. Recommend warm salt water  gargles 3-4 times daily as needed for throat pain or discomfort.  You may also use over-the-counter Chloraseptic throat spray or throat lozenges while symptoms persist. Recommend a soft diet to include soup, broth, yogurt, pudding, or Jell-O while symptoms persist. Discard your toothbrush after 3 days. If symptoms fail to improve with this treatment, you may follow-up in this clinic or with your primary care physician for further evaluation. Follow-up as needed.

## 2024-04-28 NOTE — ED Triage Notes (Signed)
 Sore throat, cough, runny nose, and right ear pain, body aches x 2 days. Taking mucinex and robitussin.
